# Patient Record
Sex: Female | Born: 2000 | Race: White | Hispanic: No | Marital: Single | Smoking: Never smoker
Health system: Southern US, Community
[De-identification: ages and names within clinical notes are randomized; demographics above are authoritative.]

## PROBLEM LIST (undated history)

## (undated) DIAGNOSIS — J45909 Unspecified asthma, uncomplicated: Secondary | ICD-10-CM

## (undated) DIAGNOSIS — F419 Anxiety disorder, unspecified: Secondary | ICD-10-CM

---

## 2019-11-16 ENCOUNTER — Encounter: Payer: Self-pay | Admitting: *Deleted

## 2019-11-16 ENCOUNTER — Other Ambulatory Visit: Payer: Self-pay

## 2019-11-16 ENCOUNTER — Ambulatory Visit
Admission: EM | Admit: 2019-11-16 | Discharge: 2019-11-16 | Disposition: A | Discharge status: Home / Self Care | Payer: Self-pay

## 2019-11-16 DIAGNOSIS — N390 Urinary tract infection, site not specified: Secondary | ICD-10-CM | POA: Insufficient documentation

## 2019-11-16 HISTORY — DX: Anxiety disorder, unspecified: F41.9

## 2019-11-16 HISTORY — DX: Unspecified asthma, uncomplicated: J45.909

## 2019-11-16 LAB — POCT URINALYSIS DIP (MANUAL ENTRY)
Bilirubin, UA: NEGATIVE
Glucose, UA: 100 mg/dL — AB
Ketones, POC UA: NEGATIVE mg/dL
Nitrite, UA: POSITIVE — AB
Protein Ur, POC: NEGATIVE mg/dL
Spec Grav, UA: 1.02 (ref 1.010–1.025)
Urobilinogen, UA: 1 E.U./dL
pH, UA: 7 (ref 5.0–8.0)

## 2019-11-16 MED ORDER — CEPHALEXIN 500 MG PO CAPS: 500.0000 mg | ORAL_CAPSULE | Freq: Four times a day (QID) | ORAL | 0 refills | Status: AC

## 2019-11-16 NOTE — ED Triage Notes (Signed)
Patient in office today c/o UTI x 2d Freq,discomfort   OTC: AZO

## 2019-11-16 NOTE — ED Provider Notes (Signed)
Susan Bright    CSN: [id number] Arrival date & time: 11/16/19  1226      History   Chief Complaint Chief Complaint  Patient presents with  . Urinary Urgency  . Polyuria  . Dysuria    HPI Susan Bright is a 18 y.o. female.   Patient presents with a 2-day history of dysuria, frequency, urgency.  She has attempted treatment at home with AZO.  Denies fever, chills, abdominal pain, vaginal symptoms, or other concerns.  LMP: 11/08/2019.  The history is provided by the patient.    Past Medical History:  Diagnosis Date  . Anxiety   . Asthma     There are no active problems to display for this patient.   History reviewed. No pertinent surgical history.  OB History   No obstetric history on file.      Home Medications    Prior to Admission medications   Medication Sig Start Date End Date Taking? Authorizing Provider  sertraline (ZOLOFT) 50 MG tablet Take 50 mg by mouth daily.   Yes [provider]  cephALEXin (KEFLEX) 500 MG capsule Take 1 capsule (500 mg total) by mouth 4 (four) times daily. 11/16/19   [name], NP    Family History Family History  Problem Relation Age of Onset  . Cancer Mother   . Bipolar disorder Father   . Diabetes Father     Social History Social History   Tobacco Use  . Smoking status: Never Smoker  . Smokeless tobacco: Never Used  Substance Use Topics  . Alcohol use: Yes    Comment: occ  . Drug use: Not on file     Allergies   Patient has no known allergies.   Review of Systems Review of Systems  Constitutional: Negative for chills and fever.  HENT: Negative for ear pain and sore throat.   Eyes: Negative for pain and visual disturbance.  Respiratory: Negative for cough and shortness of breath.   Cardiovascular: Negative for chest pain and palpitations.  Gastrointestinal: Negative for abdominal pain and vomiting.  Genitourinary: Positive for dysuria, frequency and urgency. Negative for hematuria,  pelvic pain and vaginal discharge.  Musculoskeletal: Negative for arthralgias and back pain.  Skin: Negative for color change and rash.  Neurological: Negative for seizures and syncope.  All other systems reviewed and are negative.    Physical Exam Triage Vital Signs ED Triage Vitals  Enc Vitals Group     BP 11/16/19 1231 126/76     Pulse Rate 11/16/19 1231 84     Resp 11/16/19 1231 16     Temp 11/16/19 1231 98.4 F (36.9 C)     Temp Source 11/16/19 1231 Oral     SpO2 11/16/19 1231 98 %     Weight 11/16/19 1252 180 lb (81.6 kg)     Height --      Head Circumference --      Peak Flow --      Pain Score 11/16/19 1228 0     Pain Loc --      Pain Edu? --      Excl. in GC? --    No data found.  Updated Vital Signs BP 126/76 (BP Location: Left Arm)   Pulse 84   Temp 98.4 F (36.9 C) (Oral)   Resp 16   Wt 180 lb (81.6 kg)   LMP 11/08/2019   SpO2 98%   Visual Acuity Right Eye Distance:   Left Eye Distance:  Bilateral Distance:    Right Eye Near:   Left Eye Near:    Bilateral Near:     Physical Exam Vitals signs and nursing note reviewed.  Constitutional:      General: She is not in acute distress.    Appearance: She is well-developed. She is not ill-appearing.  HENT:     Head: Normocephalic and atraumatic.     Mouth/Throat:     Mouth: Mucous membranes are moist.     Pharynx: Oropharynx is clear.  Eyes:     Conjunctiva/sclera: Conjunctivae normal.  Neck:     Musculoskeletal: Neck supple.  Cardiovascular:     Rate and Rhythm: Normal rate and regular rhythm.     Heart sounds: No murmur.  Pulmonary:     Effort: Pulmonary effort is normal. No respiratory distress.     Breath sounds: Normal breath sounds.  Abdominal:     General: Bowel sounds are normal.     Palpations: Abdomen is soft.     Tenderness: There is no abdominal tenderness. There is no right CVA tenderness, left CVA tenderness, guarding or rebound.  Skin:    General: Skin is warm and dry.      Findings: No rash.  Neurological:     General: No focal deficit present.     Mental Status: She is alert and oriented to person, place, and time.  Psychiatric:        Mood and Affect: Mood normal.        Behavior: Behavior normal.      UC Treatments / Results  Labs (all labs ordered are listed, but only abnormal results are displayed) Labs Reviewed  POCT URINALYSIS DIP (MANUAL ENTRY) - Abnormal; Notable for the following components:      Result Value   Color, UA orange (*)    Glucose, UA =100 (*)    Blood, UA trace-intact (*)    Nitrite, UA Positive (*)    Leukocytes, UA Small (1+) (*)    All other components within normal limits  URINE CULTURE    EKG   Radiology No results found.  Procedures Procedures (including critical care time)  Medications Ordered in UC Medications - No data to display  Initial Impression / Assessment and Plan / UC Course  I have reviewed the triage vital signs and the nursing notes.  Pertinent labs & imaging results that were available during my care of the patient were reviewed by me and considered in my medical decision making (see chart for details).    UTI.  Urine culture pending.  Treating with Keflex.  Instructed patient to return here or follow-up with her PCP if her symptoms or not improving.  Discussed with patient that her urine needs to be rechecked in 2 to 4 weeks as today's sample had a small amount of glucose.  Patient agrees to plan of care.     Final Clinical Impressions(s) / UC Diagnoses   Final diagnoses:  Urinary tract infection without hematuria, site unspecified     Discharge Instructions     Your urine shows signs of an infection.  A urine culture is pending.  We will call you if your antibiotic needs to be changed.    Take the prescribed antibiotic as directed.    Return here or follow-up with your primary care provider if your symptoms or not improving.    Your urine shows a small amount of glucose.  This  needs to be rechecked by your primary care provider  in 2 to 4 weeks.        ED Prescriptions    Medication Sig Dispense Auth. Provider   cephALEXin (KEFLEX) 500 MG capsule Take 1 capsule (500 mg total) by mouth 4 (four) times daily. 20 capsule [name], Quin Mcpherson H, NP     PDMP not reviewed this encounter.   [name], Josip Merolla H, NP 11/16/19 1311

## 2019-11-16 NOTE — Discharge Instructions (Signed)
Your urine shows signs of an infection.  A urine culture is pending.  We will call you if your antibiotic needs to be changed.    Take the prescribed antibiotic as directed.    Return here or follow-up with your primary care provider if your symptoms or not improving.    Your urine shows a small amount of glucose.  This needs to be rechecked by your primary care provider in 2 to 4 weeks.

## 2019-11-16 NOTE — ED Triage Notes (Signed)
Patient reports 2 day history of polyuria, dysuria, and urinary frequency. Reports hx of UTIs. Has been taken AZO. No fevers or back pain. No lower abdomen pain.

## 2019-11-17 LAB — URINE CULTURE: Culture: <10000 — AB

## 2020-07-05 ENCOUNTER — Emergency Department: Payer: PRIVATE HEALTH INSURANCE

## 2020-07-05 ENCOUNTER — Encounter: Payer: Self-pay | Admitting: Emergency Medicine

## 2020-07-05 ENCOUNTER — Ambulatory Visit: Payer: Self-pay

## 2020-07-05 ENCOUNTER — Other Ambulatory Visit: Payer: Self-pay

## 2020-07-05 ENCOUNTER — Emergency Department
Admission: EM | Admit: 2020-07-05 | Discharge: 2020-07-05 | Disposition: A | Discharge status: Home / Self Care | Payer: PRIVATE HEALTH INSURANCE | Attending: Emergency Medicine | Admitting: Emergency Medicine

## 2020-07-05 DIAGNOSIS — Y9389 Activity, other specified: Secondary | ICD-10-CM | POA: Diagnosis not present

## 2020-07-05 DIAGNOSIS — R519 Headache, unspecified: Secondary | ICD-10-CM | POA: Diagnosis not present

## 2020-07-05 DIAGNOSIS — Y999 Unspecified external cause status: Secondary | ICD-10-CM | POA: Insufficient documentation

## 2020-07-05 DIAGNOSIS — J45909 Unspecified asthma, uncomplicated: Secondary | ICD-10-CM | POA: Diagnosis not present

## 2020-07-05 DIAGNOSIS — Y92410 Unspecified street and highway as the place of occurrence of the external cause: Secondary | ICD-10-CM | POA: Diagnosis not present

## 2020-07-05 DIAGNOSIS — M25571 Pain in right ankle and joints of right foot: Secondary | ICD-10-CM | POA: Insufficient documentation

## 2020-07-05 MED ORDER — METHOCARBAMOL 500 MG PO TABS
500.0000 mg | ORAL_TABLET | Freq: Once | ORAL | Status: AC
  Administered 2020-07-05: 500 mg via ORAL
  Filled 2020-07-05: qty 1

## 2020-07-05 MED ORDER — BACLOFEN 5 MG PO TABS: 5.0000 mg | ORAL_TABLET | Freq: Three times a day (TID) | ORAL | 0 refills | Status: AC | PRN

## 2020-07-05 MED ORDER — LIDOCAINE 5 % EX PTCH: 1.0000 | MEDICATED_PATCH | CUTANEOUS | 0 refills | Status: AC

## 2020-07-05 MED ORDER — LIDOCAINE 5 % EX PTCH
1.0000 | MEDICATED_PATCH | CUTANEOUS | Status: DC
  Administered 2020-07-05: 1 via TRANSDERMAL
  Filled 2020-07-05: qty 1
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
